# Patient Record
Sex: Male | Born: 1970 | Race: White | Hispanic: No | Marital: Married | Smoking: Current every day smoker
Health system: Southern US, Community
[De-identification: ages and names within clinical notes are randomized; demographics above are authoritative.]

## PROBLEM LIST (undated history)

## (undated) DIAGNOSIS — F419 Anxiety disorder, unspecified: Secondary | ICD-10-CM

## (undated) DIAGNOSIS — E119 Type 2 diabetes mellitus without complications: Secondary | ICD-10-CM

## (undated) DIAGNOSIS — F17200 Nicotine dependence, unspecified, uncomplicated: Secondary | ICD-10-CM

## (undated) DIAGNOSIS — F1010 Alcohol abuse, uncomplicated: Secondary | ICD-10-CM

## (undated) DIAGNOSIS — J45909 Unspecified asthma, uncomplicated: Secondary | ICD-10-CM

---

## 2007-09-12 ENCOUNTER — Emergency Department (HOSPITAL_BASED_OUTPATIENT_CLINIC_OR_DEPARTMENT_OTHER): Admission: EM | Admit: 2007-09-12 | Discharge: 2007-09-12 | Payer: Self-pay | Admitting: Emergency Medicine

## 2007-09-14 ENCOUNTER — Emergency Department (HOSPITAL_BASED_OUTPATIENT_CLINIC_OR_DEPARTMENT_OTHER): Admission: EM | Admit: 2007-09-14 | Discharge: 2007-09-14 | Payer: Self-pay | Admitting: Emergency Medicine

## 2009-11-03 ENCOUNTER — Ambulatory Visit (HOSPITAL_COMMUNITY)
Admission: RE | Admit: 2009-11-03 | Discharge: 2009-11-03 | Payer: Self-pay | Source: Home / Self Care | Admitting: Family Medicine

## 2010-12-20 ENCOUNTER — Ambulatory Visit (HOSPITAL_COMMUNITY)
Admission: RE | Admit: 2010-12-20 | Discharge: 2010-12-20 | Disposition: A | Discharge status: Home / Self Care | Payer: PRIVATE HEALTH INSURANCE | Source: Ambulatory Visit | Attending: Family Medicine | Admitting: Family Medicine

## 2010-12-20 ENCOUNTER — Other Ambulatory Visit (HOSPITAL_COMMUNITY): Payer: Self-pay | Admitting: Family Medicine

## 2010-12-20 DIAGNOSIS — J069 Acute upper respiratory infection, unspecified: Secondary | ICD-10-CM

## 2010-12-20 DIAGNOSIS — R059 Cough, unspecified: Secondary | ICD-10-CM | POA: Insufficient documentation

## 2010-12-20 DIAGNOSIS — R05 Cough: Secondary | ICD-10-CM | POA: Insufficient documentation

## 2012-01-08 ENCOUNTER — Other Ambulatory Visit (HOSPITAL_COMMUNITY): Payer: Self-pay | Admitting: Family Medicine

## 2012-01-08 ENCOUNTER — Ambulatory Visit (HOSPITAL_COMMUNITY)
Admission: RE | Admit: 2012-01-08 | Discharge: 2012-01-08 | Disposition: A | Discharge status: Home / Self Care | Payer: PRIVATE HEALTH INSURANCE | Source: Ambulatory Visit | Attending: Family Medicine | Admitting: Family Medicine

## 2012-01-08 DIAGNOSIS — J4489 Other specified chronic obstructive pulmonary disease: Secondary | ICD-10-CM | POA: Insufficient documentation

## 2012-01-08 DIAGNOSIS — F17200 Nicotine dependence, unspecified, uncomplicated: Secondary | ICD-10-CM | POA: Insufficient documentation

## 2012-01-08 DIAGNOSIS — R062 Wheezing: Secondary | ICD-10-CM

## 2012-01-08 DIAGNOSIS — J449 Chronic obstructive pulmonary disease, unspecified: Secondary | ICD-10-CM | POA: Insufficient documentation

## 2013-12-28 ENCOUNTER — Ambulatory Visit (HOSPITAL_COMMUNITY)
Admission: RE | Admit: 2013-12-28 | Discharge: 2013-12-28 | Disposition: A | Discharge status: Home / Self Care | Payer: 59 | Source: Ambulatory Visit | Attending: Family Medicine | Admitting: Family Medicine

## 2013-12-28 ENCOUNTER — Other Ambulatory Visit (HOSPITAL_COMMUNITY): Payer: Self-pay | Admitting: Family Medicine

## 2013-12-28 DIAGNOSIS — M25511 Pain in right shoulder: Secondary | ICD-10-CM | POA: Insufficient documentation

## 2013-12-28 DIAGNOSIS — R05 Cough: Secondary | ICD-10-CM

## 2013-12-28 DIAGNOSIS — R059 Cough, unspecified: Secondary | ICD-10-CM

## 2014-07-06 ENCOUNTER — Other Ambulatory Visit: Payer: Self-pay | Admitting: Orthopedic Surgery

## 2014-07-18 ENCOUNTER — Encounter (HOSPITAL_BASED_OUTPATIENT_CLINIC_OR_DEPARTMENT_OTHER): Payer: Self-pay | Admitting: *Deleted

## 2014-07-21 ENCOUNTER — Ambulatory Visit (HOSPITAL_BASED_OUTPATIENT_CLINIC_OR_DEPARTMENT_OTHER): Payer: BLUE CROSS/BLUE SHIELD | Admitting: Anesthesiology

## 2014-07-21 ENCOUNTER — Ambulatory Visit (HOSPITAL_BASED_OUTPATIENT_CLINIC_OR_DEPARTMENT_OTHER)
Admission: RE | Admit: 2014-07-21 | Discharge: 2014-07-21 | Disposition: A | Discharge status: Home / Self Care | Payer: BLUE CROSS/BLUE SHIELD | Source: Ambulatory Visit | Attending: Orthopedic Surgery | Admitting: Orthopedic Surgery

## 2014-07-21 ENCOUNTER — Encounter (HOSPITAL_BASED_OUTPATIENT_CLINIC_OR_DEPARTMENT_OTHER)
Admission: RE | Disposition: A | Discharge status: Home / Self Care | Payer: Self-pay | Source: Ambulatory Visit | Attending: Orthopedic Surgery

## 2014-07-21 ENCOUNTER — Encounter (HOSPITAL_BASED_OUTPATIENT_CLINIC_OR_DEPARTMENT_OTHER): Payer: Self-pay | Admitting: Anesthesiology

## 2014-07-21 DIAGNOSIS — E119 Type 2 diabetes mellitus without complications: Secondary | ICD-10-CM | POA: Insufficient documentation

## 2014-07-21 DIAGNOSIS — F17210 Nicotine dependence, cigarettes, uncomplicated: Secondary | ICD-10-CM | POA: Diagnosis not present

## 2014-07-21 DIAGNOSIS — F1010 Alcohol abuse, uncomplicated: Secondary | ICD-10-CM | POA: Diagnosis not present

## 2014-07-21 DIAGNOSIS — F419 Anxiety disorder, unspecified: Secondary | ICD-10-CM | POA: Insufficient documentation

## 2014-07-21 DIAGNOSIS — M1990 Unspecified osteoarthritis, unspecified site: Secondary | ICD-10-CM | POA: Insufficient documentation

## 2014-07-21 DIAGNOSIS — J45909 Unspecified asthma, uncomplicated: Secondary | ICD-10-CM | POA: Diagnosis not present

## 2014-07-21 DIAGNOSIS — G5602 Carpal tunnel syndrome, left upper limb: Secondary | ICD-10-CM | POA: Insufficient documentation

## 2014-07-21 HISTORY — PX: CARPAL TUNNEL RELEASE: SHX101

## 2014-07-21 HISTORY — DX: Type 2 diabetes mellitus without complications: E11.9

## 2014-07-21 HISTORY — DX: Nicotine dependence, unspecified, uncomplicated: F17.200

## 2014-07-21 HISTORY — DX: Unspecified asthma, uncomplicated: J45.909

## 2014-07-21 HISTORY — DX: Alcohol abuse, uncomplicated: F10.10

## 2014-07-21 HISTORY — DX: Anxiety disorder, unspecified: F41.9

## 2014-07-21 LAB — POCT HEMOGLOBIN-HEMACUE: HEMOGLOBIN: 16.8 g/dL (ref 13.0–17.0)

## 2014-07-21 SURGERY — CARPAL TUNNEL RELEASE
Anesthesia: Monitor Anesthesia Care | Site: Wrist | Laterality: Left

## 2014-07-21 MED ORDER — BUPIVACAINE HCL (PF) 0.25 % IJ SOLN
INTRAMUSCULAR | Status: DC | PRN
  Administered 2014-07-21: 7 mL

## 2014-07-21 MED ORDER — LIDOCAINE HCL (CARDIAC) 20 MG/ML IV SOLN
INTRAVENOUS | Status: DC | PRN
  Administered 2014-07-21: 10 mg via INTRAVENOUS

## 2014-07-21 MED ORDER — MIDAZOLAM HCL 2 MG/2ML IJ SOLN
INTRAMUSCULAR | Status: AC
  Filled 2014-07-21: qty 2

## 2014-07-21 MED ORDER — MIDAZOLAM HCL 5 MG/5ML IJ SOLN
INTRAMUSCULAR | Status: DC | PRN
  Administered 2014-07-21: 2 mg via INTRAVENOUS

## 2014-07-21 MED ORDER — FENTANYL CITRATE (PF) 100 MCG/2ML IJ SOLN
INTRAMUSCULAR | Status: AC
  Filled 2014-07-21: qty 6

## 2014-07-21 MED ORDER — MIDAZOLAM HCL 2 MG/2ML IJ SOLN: 1.0000 mg | INTRAMUSCULAR | Status: DC | PRN

## 2014-07-21 MED ORDER — CEFAZOLIN SODIUM-DEXTROSE 2-3 GM-% IV SOLR
2.0000 g | INTRAVENOUS | Status: AC
Start: 2014-07-21 — End: 2014-07-21
  Administered 2014-07-21: 2 g via INTRAVENOUS

## 2014-07-21 MED ORDER — LIDOCAINE HCL (PF) 0.5 % IJ SOLN
INTRAMUSCULAR | Status: DC | PRN
  Administered 2014-07-21: 30 mL via INTRAVENOUS

## 2014-07-21 MED ORDER — ONDANSETRON HCL 4 MG/2ML IJ SOLN
INTRAMUSCULAR | Status: DC | PRN
  Administered 2014-07-21: 4 mg via INTRAVENOUS

## 2014-07-21 MED ORDER — LACTATED RINGERS IV SOLN
INTRAVENOUS | Status: DC
  Administered 2014-07-21: 09:00:00 via INTRAVENOUS

## 2014-07-21 MED ORDER — OXYCODONE HCL 5 MG PO TABS: 5.0000 mg | ORAL_TABLET | Freq: Once | ORAL | Status: DC | PRN

## 2014-07-21 MED ORDER — CHLORHEXIDINE GLUCONATE 4 % EX LIQD: 60.0000 mL | Freq: Once | CUTANEOUS | Status: DC

## 2014-07-21 MED ORDER — CEFAZOLIN SODIUM-DEXTROSE 2-3 GM-% IV SOLR: 2.0000 g | INTRAVENOUS | Status: DC

## 2014-07-21 MED ORDER — FENTANYL CITRATE (PF) 100 MCG/2ML IJ SOLN: 50.0000 ug | INTRAMUSCULAR | Status: DC | PRN

## 2014-07-21 MED ORDER — FENTANYL CITRATE (PF) 100 MCG/2ML IJ SOLN: 25.0000 ug | INTRAMUSCULAR | Status: DC | PRN

## 2014-07-21 MED ORDER — PROPOFOL INFUSION 10 MG/ML OPTIME
INTRAVENOUS | Status: DC | PRN
  Administered 2014-07-21: 75 ug/kg/min via INTRAVENOUS

## 2014-07-21 MED ORDER — CEFAZOLIN SODIUM-DEXTROSE 2-3 GM-% IV SOLR
INTRAVENOUS | Status: AC
  Filled 2014-07-21: qty 50

## 2014-07-21 MED ORDER — GLYCOPYRROLATE 0.2 MG/ML IJ SOLN: 0.2000 mg | Freq: Once | INTRAMUSCULAR | Status: DC | PRN

## 2014-07-21 MED ORDER — OXYCODONE HCL 5 MG/5ML PO SOLN: 5.0000 mg | Freq: Once | ORAL | Status: DC | PRN

## 2014-07-21 MED ORDER — ONDANSETRON HCL 4 MG/2ML IJ SOLN: 4.0000 mg | Freq: Four times a day (QID) | INTRAMUSCULAR | Status: DC | PRN

## 2014-07-21 MED ORDER — FENTANYL CITRATE (PF) 100 MCG/2ML IJ SOLN
INTRAMUSCULAR | Status: DC | PRN
  Administered 2014-07-21 (×2): 50 ug via INTRAVENOUS

## 2014-07-21 MED ORDER — SCOPOLAMINE 1 MG/3DAYS TD PT72: 1.0000 | MEDICATED_PATCH | Freq: Once | TRANSDERMAL | Status: DC | PRN

## 2014-07-21 SURGICAL SUPPLY — 35 items
BLADE SURG 15 STRL LF DISP TIS (BLADE) ×1 IMPLANT
BLADE SURG 15 STRL SS (BLADE) ×2
BNDG COHESIVE 3X5 TAN STRL LF (GAUZE/BANDAGES/DRESSINGS) ×3 IMPLANT
BNDG ESMARK 4X9 LF (GAUZE/BANDAGES/DRESSINGS) IMPLANT
BNDG GAUZE ELAST 4 BULKY (GAUZE/BANDAGES/DRESSINGS) ×3 IMPLANT
CHLORAPREP W/TINT 26ML (MISCELLANEOUS) ×3 IMPLANT
CORDS BIPOLAR (ELECTRODE) ×3 IMPLANT
COVER BACK TABLE 60X90IN (DRAPES) ×3 IMPLANT
COVER MAYO STAND STRL (DRAPES) ×3 IMPLANT
CUFF TOURNIQUET SINGLE 18IN (TOURNIQUET CUFF) ×3 IMPLANT
DRAPE EXTREMITY T 121X128X90 (DRAPE) ×3 IMPLANT
DRAPE SURG 17X23 STRL (DRAPES) ×3 IMPLANT
DRSG PAD ABDOMINAL 8X10 ST (GAUZE/BANDAGES/DRESSINGS) ×3 IMPLANT
GAUZE SPONGE 4X4 12PLY STRL (GAUZE/BANDAGES/DRESSINGS) ×3 IMPLANT
GAUZE XEROFORM 1X8 LF (GAUZE/BANDAGES/DRESSINGS) ×3 IMPLANT
GLOVE BIO SURGEON STRL SZ 6.5 (GLOVE) ×2 IMPLANT
GLOVE BIO SURGEONS STRL SZ 6.5 (GLOVE) ×1
GLOVE BIOGEL PI IND STRL 7.0 (GLOVE) ×2 IMPLANT
GLOVE BIOGEL PI IND STRL 8.5 (GLOVE) ×1 IMPLANT
GLOVE BIOGEL PI INDICATOR 7.0 (GLOVE) ×4
GLOVE BIOGEL PI INDICATOR 8.5 (GLOVE) ×2
GLOVE SURG ORTHO 8.0 STRL STRW (GLOVE) ×3 IMPLANT
GOWN STRL REUS W/ TWL LRG LVL3 (GOWN DISPOSABLE) ×1 IMPLANT
GOWN STRL REUS W/TWL LRG LVL3 (GOWN DISPOSABLE) ×2
GOWN STRL REUS W/TWL XL LVL3 (GOWN DISPOSABLE) ×3 IMPLANT
NEEDLE PRECISIONGLIDE 27X1.5 (NEEDLE) ×3 IMPLANT
NS IRRIG 1000ML POUR BTL (IV SOLUTION) ×3 IMPLANT
PACK BASIN DAY SURGERY FS (CUSTOM PROCEDURE TRAY) ×3 IMPLANT
STOCKINETTE 4X48 STRL (DRAPES) ×3 IMPLANT
SUT ETHILON 4 0 PS 2 18 (SUTURE) ×3 IMPLANT
SUT VICRYL 4-0 PS2 18IN ABS (SUTURE) IMPLANT
SYR BULB 3OZ (MISCELLANEOUS) ×3 IMPLANT
SYR CONTROL 10ML LL (SYRINGE) ×3 IMPLANT
TOWEL OR 17X24 6PK STRL BLUE (TOWEL DISPOSABLE) ×3 IMPLANT
UNDERPAD 30X30 (UNDERPADS AND DIAPERS) ×3 IMPLANT

## 2014-07-21 NOTE — Anesthesia Preprocedure Evaluation (Signed)
Anesthesia Evaluation  Patient identified by MRN, date of birth, ID band Patient awake    Reviewed: Allergy & Precautions, NPO status , Patient's Chart, lab work & pertinent test results  Airway Mallampati: II   Neck ROM: full    Dental   Pulmonary asthma , Current Smoker,  breath sounds clear to auscultation        Cardiovascular negative cardio ROS  Rhythm:regular Rate:Normal     Neuro/Psych Anxiety    GI/Hepatic (+)     substance abuse  alcohol use,   Endo/Other  diabetes, Type 2  Renal/GU      Musculoskeletal   Abdominal   Peds  Hematology   Anesthesia Other Findings   Reproductive/Obstetrics                             Anesthesia Physical Anesthesia Plan  ASA: II  Anesthesia Plan: MAC and Bier Block   Post-op Pain Management:    Induction: Intravenous  Airway Management Planned: Simple Face Mask  Additional Equipment:   Intra-op Plan:   Post-operative Plan:   Informed Consent: I have reviewed the patients History and Physical, chart, labs and discussed the procedure including the risks, benefits and alternatives for the proposed anesthesia with the patient or authorized representative who has indicated his/her understanding and acceptance.     Plan Discussed with: CRNA, Anesthesiologist and Surgeon  Anesthesia Plan Comments:         Anesthesia Quick Evaluation

## 2014-07-21 NOTE — Brief Op Note (Signed)
07/21/2014  9:53 AM  PATIENT:  Susa Loffleravid J Thai  44 y.o. male  PRE-OPERATIVE DIAGNOSIS:  LEFT CARPAL TUNNEL SYNDROME  POST-OPERATIVE DIAGNOSIS:  left carpal tunnel syndrome  PROCEDURE:  Procedure(s): LEFT CARPAL TUNNEL RELEASE (Left)  SURGEON:  Surgeon(s) and Role:    * Cindee SaltGary Sarafina Puthoff, MD - Primary  PHYSICIAN ASSISTANT:   ASSISTANTS: none   ANESTHESIA:   local and regional  EBL:  Total I/O In: 800 [I.V.:800] Out: -   BLOOD ADMINISTERED:none  DRAINS: none   LOCAL MEDICATIONS USED:  BUPIVICAINE   SPECIMEN:  No Specimen  DISPOSITION OF SPECIMEN:  N/A  COUNTS:  YES  TOURNIQUET:   Total Tourniquet Time Documented: Forearm (Left) - 17 minutes Total: Forearm (Left) - 17 minutes   DICTATION: .Other Dictation: Dictation Number [id number]  PLAN OF CARE: Discharge to home after PACU  PATIENT DISPOSITION:  PACU - hemodynamically stable.

## 2014-07-21 NOTE — Discharge Instructions (Addendum)

## 2014-07-21 NOTE — H&P (Signed)
  Mitchell Phillips is 44 years old complaining of numbness and tingling both hands, left greater than right. This has been going on and says it is not getting any better. He was last seen in 2014 in January. He has no new history of injury. He is awakened occasionally at night. He has seen a neurologist. He complains of intermittent, severe throbbing type pain with a feeling numbness. States it is getting worse. He has had his nerve conductions done revealing carpal tunnel syndrome bilaterally with motor delay of 5.3/left and 5.4/right;  sensory delay 3.6/left and 4.1/right;  amplitude diminution to 15/left and 12/right  PAST MEDICAL HISTORY:  He has no known drug allergies. He takes no medicines other than Turmeric. He has had no surgery. He does have a history of diabetes.  FAMILY MEDICAL HISTORY: Positive for diabetes, high BP and arthritis.   SOCIAL HISTORY:  He smokes 1 PPD advised to quit and the reasons behind this. He drinks socially. 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  REVIEW OF SYSTEMS: Positive for shortness of breath otherwise negative 14 points Mitchell LofflerDavid J Phillips is an 44 y.o. male.   Chief Complaint: CTS left HPI: see above  Past Medical History  Diagnosis Date  . Asthma   . Diabetes mellitus without complication     diet controlled  . Anxiety   . Heavy smoker   . ETOH abuse     History reviewed. No pertinent past surgical history.  History reviewed. No pertinent family history. Social History:  reports that he has been smoking.  He does not have any smokeless tobacco history on file. He reports that he drinks alcohol. His drug history is not on file.  Allergies: No Known Allergies  No prescriptions prior to admission    No results found for this or any previous visit (from the past 48 hour(s)).  No results found.   Pertinent items are noted in HPI.  Height 6' (1.829 m), weight 72.576 kg (160 lb).  General appearance: alert, cooperative and appears stated  age Head: Normocephalic, without obvious abnormality Neck: no JVD Resp: clear to auscultation bilaterally Cardio: regular rate and rhythm, S1, S2 normal, no murmur, click, rub or gallop GI: soft, non-tender; bowel sounds normal; no masses,  no organomegaly Extremities: numbness left hand Pulses: 2+ and symmetric Skin: Skin color, texture, turgor normal. No rashes or lesions Neurologic: Grossly normal Incision/Wound: na  Assessment/Plan DIAGNOSIS: Left carpal tunnel syndrome.  PLAN: He is desirous of proceeding to have this surgically released in that these are worsening from his study in 2014.  We have discussed possibility of injection.  He has declined and he would like to have the left side operated on.   The pre, peri and postoperative course were discussed along with the risks and complications.  The patient is aware there is no guarantee with the surgery, possibility of infection, recurrence, injury to arteries, nerves, tendons, incomplete relief of symptoms and dystrophy.  He is scheduled for left carpal tunnel release as an outpatient under regional anesthesia.  Devron Cohick R 07/21/2014, 7:45 AM

## 2014-07-21 NOTE — Anesthesia Postprocedure Evaluation (Signed)
Anesthesia Post Note  Patient: Mitchell LofflerDavid J Phillips  Procedure(s) Performed: Procedure(s) (LRB): LEFT CARPAL TUNNEL RELEASE (Left)  Anesthesia type: MAC  Patient location: PACU  Post pain: Pain level controlled and Adequate analgesia  Post assessment: Post-op Vital signs reviewed, Patient's Cardiovascular Status Stable and Respiratory Function Stable  Last Vitals:  Filed Vitals:   07/21/14 1042  BP: 125/78  Pulse: 56  Temp: 36.6 C  Resp: 20    Post vital signs: Reviewed and stable  Level of consciousness: awake, alert  and oriented  Complications: No apparent anesthesia complications

## 2014-07-21 NOTE — Op Note (Signed)
Dictation Number [id number]

## 2014-07-21 NOTE — Transfer of Care (Signed)
Immediate Anesthesia Transfer of Care Note  Patient: Mitchell LofflerDavid J Scheidegger  Procedure(s) Performed: Procedure(s): LEFT CARPAL TUNNEL RELEASE (Left)  Patient Location: PACU  Anesthesia Type:Bier block  Level of Consciousness: awake and patient cooperative  Airway & Oxygen Therapy: Patient Spontanous Breathing and Patient connected to face mask oxygen  Post-op Assessment: Report given to RN and Post -op Vital signs reviewed and stable  Post vital signs: Reviewed and stable  Last Vitals:  Filed Vitals:   07/21/14 0840  BP: 119/82  Pulse: 66  Temp: 36.6 C  Resp: 18    Complications: No apparent anesthesia complications

## 2014-07-22 ENCOUNTER — Encounter (HOSPITAL_BASED_OUTPATIENT_CLINIC_OR_DEPARTMENT_OTHER): Payer: Self-pay | Admitting: Orthopedic Surgery

## 2014-07-22 NOTE — Op Note (Signed)
NAMScarlette Phillips:  Efferson, Burnell              ACCOUNT NO.:  [id number]  MEDICAL RECORD NO.:  [id number]  LOCATION:                               FACILITY:  [facility]  PHYSICIAN:  Cindee SaltGary Salvator Seppala, M.D.       DATE OF BIRTH:  01/05/1948  DATE OF PROCEDURE:  07/21/2014 DATE OF DISCHARGE:  07/21/2014                              OPERATIVE REPORT   PREOPERATIVE DIAGNOSIS:  Carpal tunnel syndrome, left hand.  POSTOPERATIVE DIAGNOSIS:  Carpal tunnel syndrome, left hand.  OPERATION:  Decompression of left median nerve.  SURGEON:  Cindee SaltGary Luretta Everly, M.D.  ANESTHESIA:  Forearm-based IV regional with local infiltration.  ANESTHESIOLOGIST:  Achille RichAdam Hodierne, M.D.  HISTORY:  The patient is a 44 year old male, with a history of carpal tunnel syndrome and nerve conduction is positive bilaterally.  He has elected to undergo surgical decompression.  Pre, peri, and postoperative course are discussed along with the risks and complications.  He is aware that there is no guarantee with the surgery, possibility of infection; recurrence of injury to arteries, nerves, tendons; incomplete relief of symptoms, dystrophy.  In preoperative area, the patient is seen, the extremity marked by both the patient and surgeon.  Antibiotic given.  PROCEDURE IN DETAIL:  The patient was brought to the operating room, where forearm-based IV regional anesthetic was carried out without difficulty.  He was prepped using ChloraPrep, supine position with the left arm free.  A 3-minute dry time was allowed.  Time-out taken, confirming the patient and procedure.  A longitudinal incision was made in the left palm, carried down through the subcutaneous tissue. Bleeders were electrocauterized with bipolar.  He had some feeling.  A local infiltration with 0.25% bupivacaine without epinephrine was given. The dissection was then continued down to the palmar fascia, which was split.  A superficial palmar arch identified.  The flexor tendon of the ring and  little finger identified to the ulnar side of the median nerve. The carpal retinaculum was incised with sharp dissection after placement of retractors to protect to protect both median and ulnar nerve.  A right angle and Sewall retractor were placed between skin and forearm fascia proximally, the nerve was bluntly dissected from the retinaculum and subcutaneous tissue bluntly dissected.  The distal fascia was then released for approximately 2 cm to 3 cm proximal to the wrist crease under direct vision.  The canal was explored.  Area of compression to the nerve was apparent.  Motor branch entered into muscle distally.  The wound was copiously irrigated with saline and skin closed with interrupted 4-0 nylon sutures.  A further infiltration with 0.25% bupivacaine was given approximately 8 mL was used.  Sterile compressive dressing with the fingers free was applied.  On deflation of the tourniquet, all fingers immediately pinked.  He was taken to the recovery room for observation in satisfactory condition.  He will be discharged home to [facility] on oxycodone.          ______________________________ Cindee SaltGary Corydon Schweiss, M.D.     GK/MEDQ  D:  07/21/2014  T:  07/21/2014  Job:  [id number]

## 2014-07-22 NOTE — Addendum Note (Signed)
Addendum  created 07/22/14 [id number] by Lance CoonWesley Leiyah Maultsby, CRNA   Modules edited: Charges VN

## 2014-08-25 ENCOUNTER — Other Ambulatory Visit: Payer: Self-pay | Admitting: Orthopedic Surgery

## 2014-08-29 ENCOUNTER — Encounter (HOSPITAL_BASED_OUTPATIENT_CLINIC_OR_DEPARTMENT_OTHER): Payer: Self-pay | Admitting: *Deleted

## 2014-09-01 ENCOUNTER — Ambulatory Visit (HOSPITAL_BASED_OUTPATIENT_CLINIC_OR_DEPARTMENT_OTHER): Payer: BLUE CROSS/BLUE SHIELD | Admitting: Anesthesiology

## 2014-09-01 ENCOUNTER — Encounter (HOSPITAL_BASED_OUTPATIENT_CLINIC_OR_DEPARTMENT_OTHER): Payer: Self-pay | Admitting: *Deleted

## 2014-09-01 ENCOUNTER — Encounter (HOSPITAL_BASED_OUTPATIENT_CLINIC_OR_DEPARTMENT_OTHER)
Admission: RE | Disposition: A | Discharge status: Home / Self Care | Payer: Self-pay | Source: Ambulatory Visit | Attending: Orthopedic Surgery

## 2014-09-01 ENCOUNTER — Ambulatory Visit (HOSPITAL_BASED_OUTPATIENT_CLINIC_OR_DEPARTMENT_OTHER)
Admission: RE | Admit: 2014-09-01 | Discharge: 2014-09-01 | Disposition: A | Discharge status: Home / Self Care | Payer: BLUE CROSS/BLUE SHIELD | Source: Ambulatory Visit | Attending: Orthopedic Surgery | Admitting: Orthopedic Surgery

## 2014-09-01 DIAGNOSIS — F419 Anxiety disorder, unspecified: Secondary | ICD-10-CM | POA: Diagnosis not present

## 2014-09-01 DIAGNOSIS — G5601 Carpal tunnel syndrome, right upper limb: Secondary | ICD-10-CM | POA: Insufficient documentation

## 2014-09-01 DIAGNOSIS — F17210 Nicotine dependence, cigarettes, uncomplicated: Secondary | ICD-10-CM | POA: Insufficient documentation

## 2014-09-01 DIAGNOSIS — E119 Type 2 diabetes mellitus without complications: Secondary | ICD-10-CM | POA: Diagnosis not present

## 2014-09-01 DIAGNOSIS — J45909 Unspecified asthma, uncomplicated: Secondary | ICD-10-CM | POA: Diagnosis not present

## 2014-09-01 HISTORY — PX: CARPAL TUNNEL RELEASE: SHX101

## 2014-09-01 SURGERY — CARPAL TUNNEL RELEASE
Anesthesia: Regional | Site: Wrist | Laterality: Right

## 2014-09-01 MED ORDER — BUPIVACAINE HCL (PF) 0.25 % IJ SOLN
INTRAMUSCULAR | Status: DC | PRN
Start: 2014-09-01 — End: 2014-09-01
  Administered 2014-09-01: 8 mL

## 2014-09-01 MED ORDER — ONDANSETRON HCL 4 MG/2ML IJ SOLN: 4.0000 mg | Freq: Once | INTRAMUSCULAR | Status: DC | PRN

## 2014-09-01 MED ORDER — GLYCOPYRROLATE 0.2 MG/ML IJ SOLN: 0.2000 mg | Freq: Once | INTRAMUSCULAR | Status: DC | PRN

## 2014-09-01 MED ORDER — FENTANYL CITRATE (PF) 100 MCG/2ML IJ SOLN
INTRAMUSCULAR | Status: DC | PRN
  Administered 2014-09-01 (×2): 50 ug via INTRAVENOUS

## 2014-09-01 MED ORDER — CODEINE SULFATE 15 MG PO TABS: 15.0000 mg | ORAL_TABLET | Freq: Four times a day (QID) | ORAL | Status: AC | PRN

## 2014-09-01 MED ORDER — CHLORHEXIDINE GLUCONATE 4 % EX LIQD: 60.0000 mL | Freq: Once | CUTANEOUS | Status: DC

## 2014-09-01 MED ORDER — MEPERIDINE HCL 25 MG/ML IJ SOLN: 6.2500 mg | INTRAMUSCULAR | Status: DC | PRN

## 2014-09-01 MED ORDER — CEFAZOLIN SODIUM-DEXTROSE 2-3 GM-% IV SOLR
2.0000 g | INTRAVENOUS | Status: AC
  Administered 2014-09-01: 2 g via INTRAVENOUS

## 2014-09-01 MED ORDER — CEFAZOLIN SODIUM-DEXTROSE 2-3 GM-% IV SOLR
INTRAVENOUS | Status: AC
  Filled 2014-09-01: qty 50

## 2014-09-01 MED ORDER — PROPOFOL INFUSION 10 MG/ML OPTIME
INTRAVENOUS | Status: DC | PRN
  Administered 2014-09-01: 100 ug/kg/min via INTRAVENOUS

## 2014-09-01 MED ORDER — CEFAZOLIN SODIUM-DEXTROSE 2-3 GM-% IV SOLR: 2.0000 g | INTRAVENOUS | Status: DC

## 2014-09-01 MED ORDER — BUPIVACAINE HCL (PF) 0.25 % IJ SOLN
INTRAMUSCULAR | Status: AC
  Filled 2014-09-01: qty 30

## 2014-09-01 MED ORDER — SCOPOLAMINE 1 MG/3DAYS TD PT72: 1.0000 | MEDICATED_PATCH | Freq: Once | TRANSDERMAL | Status: DC | PRN

## 2014-09-01 MED ORDER — LACTATED RINGERS IV SOLN
INTRAVENOUS | Status: DC
  Administered 2014-09-01 (×2): via INTRAVENOUS

## 2014-09-01 MED ORDER — MIDAZOLAM HCL 2 MG/2ML IJ SOLN
INTRAMUSCULAR | Status: AC
Start: 2014-09-01 — End: 2014-09-01
  Filled 2014-09-01: qty 2

## 2014-09-01 MED ORDER — HYDROMORPHONE HCL 1 MG/ML IJ SOLN: 0.2500 mg | INTRAMUSCULAR | Status: DC | PRN

## 2014-09-01 MED ORDER — MIDAZOLAM HCL 5 MG/5ML IJ SOLN
INTRAMUSCULAR | Status: DC | PRN
  Administered 2014-09-01 (×2): 1 mg via INTRAVENOUS

## 2014-09-01 MED ORDER — FENTANYL CITRATE (PF) 100 MCG/2ML IJ SOLN
INTRAMUSCULAR | Status: AC
  Filled 2014-09-01: qty 4

## 2014-09-01 MED ORDER — PROPOFOL 500 MG/50ML IV EMUL
INTRAVENOUS | Status: AC
  Filled 2014-09-01: qty 50

## 2014-09-01 SURGICAL SUPPLY — 34 items
BLADE SURG 15 STRL LF DISP TIS (BLADE) ×1 IMPLANT
BLADE SURG 15 STRL SS (BLADE) ×2
BNDG COHESIVE 3X5 TAN STRL LF (GAUZE/BANDAGES/DRESSINGS) ×3 IMPLANT
BNDG ESMARK 4X9 LF (GAUZE/BANDAGES/DRESSINGS) IMPLANT
BNDG GAUZE ELAST 4 BULKY (GAUZE/BANDAGES/DRESSINGS) ×3 IMPLANT
CHLORAPREP W/TINT 26ML (MISCELLANEOUS) ×3 IMPLANT
CORDS BIPOLAR (ELECTRODE) ×3 IMPLANT
COVER BACK TABLE 60X90IN (DRAPES) ×3 IMPLANT
COVER MAYO STAND STRL (DRAPES) ×3 IMPLANT
CUFF TOURNIQUET SINGLE 18IN (TOURNIQUET CUFF) ×3 IMPLANT
DRAPE EXTREMITY T 121X128X90 (DRAPE) ×3 IMPLANT
DRAPE SURG 17X23 STRL (DRAPES) ×3 IMPLANT
DRSG PAD ABDOMINAL 8X10 ST (GAUZE/BANDAGES/DRESSINGS) ×3 IMPLANT
GAUZE SPONGE 4X4 12PLY STRL (GAUZE/BANDAGES/DRESSINGS) ×3 IMPLANT
GAUZE XEROFORM 1X8 LF (GAUZE/BANDAGES/DRESSINGS) ×3 IMPLANT
GLOVE BIOGEL PI IND STRL 7.0 (GLOVE) ×2 IMPLANT
GLOVE BIOGEL PI IND STRL 8.5 (GLOVE) ×1 IMPLANT
GLOVE BIOGEL PI INDICATOR 7.0 (GLOVE) ×4
GLOVE BIOGEL PI INDICATOR 8.5 (GLOVE) ×2
GLOVE ECLIPSE 6.5 STRL STRAW (GLOVE) ×3 IMPLANT
GLOVE SURG ORTHO 8.0 STRL STRW (GLOVE) ×3 IMPLANT
GOWN STRL REUS W/ TWL LRG LVL3 (GOWN DISPOSABLE) ×1 IMPLANT
GOWN STRL REUS W/TWL LRG LVL3 (GOWN DISPOSABLE) ×2
GOWN STRL REUS W/TWL XL LVL3 (GOWN DISPOSABLE) ×3 IMPLANT
NEEDLE PRECISIONGLIDE 27X1.5 (NEEDLE) ×3 IMPLANT
NS IRRIG 1000ML POUR BTL (IV SOLUTION) ×3 IMPLANT
PACK BASIN DAY SURGERY FS (CUSTOM PROCEDURE TRAY) ×3 IMPLANT
STOCKINETTE 4X48 STRL (DRAPES) ×3 IMPLANT
SUT ETHILON 4 0 PS 2 18 (SUTURE) ×3 IMPLANT
SUT VICRYL 4-0 PS2 18IN ABS (SUTURE) IMPLANT
SYR BULB 3OZ (MISCELLANEOUS) ×3 IMPLANT
SYR CONTROL 10ML LL (SYRINGE) ×3 IMPLANT
TOWEL OR 17X24 6PK STRL BLUE (TOWEL DISPOSABLE) ×3 IMPLANT
UNDERPAD 30X30 (UNDERPADS AND DIAPERS) ×3 IMPLANT

## 2014-09-01 NOTE — Anesthesia Preprocedure Evaluation (Signed)
Anesthesia Evaluation  Patient identified by MRN, date of birth, ID band Patient awake    Reviewed: Allergy & Precautions, NPO status , Patient's Chart, lab work & pertinent test results  Airway Mallampati: II  TM Distance: >3 FB Neck ROM: Full    Dental   Pulmonary Current Smoker,    Pulmonary exam normal       Cardiovascular Normal cardiovascular exam    Neuro/Psych Anxiety    GI/Hepatic   Endo/Other  diabetes, Type 2, Oral Hypoglycemic Agents  Renal/GU      Musculoskeletal   Abdominal   Peds  Hematology   Anesthesia Other Findings   Reproductive/Obstetrics                             Anesthesia Physical Anesthesia Plan  ASA: II  Anesthesia Plan: Bier Block   Post-op Pain Management:    Induction: Intravenous  Airway Management Planned: Simple Face Mask  Additional Equipment:   Intra-op Plan:   Post-operative Plan:   Informed Consent: I have reviewed the patients History and Physical, chart, labs and discussed the procedure including the risks, benefits and alternatives for the proposed anesthesia with the patient or authorized representative who has indicated his/her understanding and acceptance.     Plan Discussed with: CRNA and Surgeon  Anesthesia Plan Comments:         Anesthesia Quick Evaluation

## 2014-09-01 NOTE — Anesthesia Postprocedure Evaluation (Signed)
Anesthesia Post Note  Patient: Mitchell Phillips  Procedure(s) Performed: Procedure(s) (LRB): RIGHT CARPAL TUNNEL RELEASE (Right)  Anesthesia type: general  Patient location: PACU  Post pain: Pain level controlled  Post assessment: Patient's Cardiovascular Status Stable  Last Vitals:  Filed Vitals:   09/01/14 0930  BP: 118/83  Pulse: 83  Temp: 36.5 C  Resp: 20    Post vital signs: Reviewed and stable  Level of consciousness: sedated  Complications: No apparent anesthesia complications

## 2014-09-01 NOTE — Op Note (Signed)
Dictation Number [id number]

## 2014-09-01 NOTE — Transfer of Care (Signed)
Immediate Anesthesia Transfer of Care Note  Patient: Mitchell Phillips  Procedure(s) Performed: Procedure(s): RIGHT CARPAL TUNNEL RELEASE (Right)  Patient Location: PACU  Anesthesia Type:Bier block  Level of Consciousness: awake, oriented and patient cooperative  Airway & Oxygen Therapy: Patient Spontanous Breathing and Patient connected to face mask oxygen  Post-op Assessment: Report given to RN and Post -op Vital signs reviewed and stable  Post vital signs: Reviewed and stable  Last Vitals:  Filed Vitals:   09/01/14 0720  BP: 119/80  Pulse: 77  Temp: 36.8 C  Resp: 16    Complications: No apparent anesthesia complications

## 2014-09-01 NOTE — Discharge Instructions (Addendum)

## 2014-09-01 NOTE — H&P (Signed)
  Mitchell Phillips is a 44yo male complaining of numbness and tingling both hands, left greater than right. This has been going on and says it is not getting any better. He was last seen in 2014 in January. He has no new history of injury. He is awakened occasionally at night. He has seen a neurologist. He complains of intermittent, severe throbbing type pain with a feeling numbness. States it is getting worse. He has had a left carpal tunnel release  PAST MEDICAL HISTORY:  He has no known drug allergies. He takes no medicines other than Turmeric. He has had no surgery. He does have a history of diabetes.  FAMILY MEDICAL HISTORY: Positive for diabetes, high BP and arthritis.   SOCIAL HISTORY:  He smokes 1 PPD advised to quit and the reasons behind this. He drinks socially. He is married and a Consulting civil engineer.  REVIEW OF SYSTEMS: Positive for shortness of breath otherwise negative 14 points.  Mitchell Phillips is an 44 y.o. male.   Chief Complaint: CTS right hand HPI: see above  Past Medical History  Diagnosis Date  . Asthma   . Diabetes mellitus without complication     diet controlled  . Anxiety   . Heavy smoker   . ETOH abuse     Past Surgical History  Procedure Laterality Date  . Carpal tunnel release Left 07/21/2014    Procedure: LEFT CARPAL TUNNEL RELEASE;  Surgeon: Cindee Salt, MD;  Location: [facility];  Service: Orthopedics;  Laterality: Left;    History reviewed. No pertinent family history. Social History:  reports that he has been smoking.  He does not have any smokeless tobacco history on file. He reports that he drinks alcohol. His drug history is not on file.  Allergies: No Known Allergies  Medications Prior to Admission  Medication Sig Dispense Refill  . albuterol (PROVENTIL HFA;VENTOLIN HFA) 108 (90 BASE) MCG/ACT inhaler Inhale into the lungs every 6 (six) hours as needed for wheezing or shortness of breath.    . diazepam (VALIUM) 2 MG tablet Take 2  mg by mouth every 6 (six) hours as needed for anxiety.      No results found for this or any previous visit (from the past 48 hour(s)).  No results found.   Pertinent items are noted in HPI.  Blood pressure 119/80, pulse 77, temperature 98.2 F (36.8 C), temperature source Oral, resp. rate 16, height 6' (1.829 m), weight 69.4 kg (153 lb), SpO2 98 %.  General appearance: alert, cooperative and appears stated age Head: Normocephalic, without obvious abnormality Neck: no JVD Resp: clear to auscultation bilaterally Cardio: regular rate and rhythm, S1, S2 normal, no murmur, click, rub or gallop GI: soft, non-tender; bowel sounds normal; no masses,  no organomegaly Extremities: numbness right hand Pulses: 2+ and symmetric Skin: Skin color, texture, turgor normal. No rashes or lesions Neurologic: Grossly normal Incision/Wound: na  Assessment/Plan  DIAGNOSIS: Right carpal tunnel syndrome. He would like to proceed to have the right side released. He is scheduled for right carpal tunnel release as an outpatient under regional anesthesia. Pre, peri and post op care are discussed along with risks and complications. Patient is aware there is no guarantee with surgery, possibility of infection, injury to arteries, nerves, and tendons, incomplete relief and dystrophy.  Leonarda Leis R 09/01/2014, 7:35 AM

## 2014-09-01 NOTE — Brief Op Note (Signed)
09/01/2014  9:00 AM  PATIENT:  Mitchell Phillips  44 y.o. male  PRE-OPERATIVE DIAGNOSIS:  Carpal tunnel syndrome right  G56.01  POST-OPERATIVE DIAGNOSIS:  carpal tunnelsyndrome right  PROCEDURE:  Procedure(s): RIGHT CARPAL TUNNEL RELEASE (Right)  SURGEON:  Surgeon(s) and Role:    * Cindee Salt, MD - Primary  PHYSICIAN ASSISTANT:   ASSISTANTS: none   ANESTHESIA:   local and regional  EBL:  Total I/O In: 1200 [I.V.:1200] Out: -   BLOOD ADMINISTERED:none  DRAINS: none   LOCAL MEDICATIONS USED:  BUPIVICAINE   SPECIMEN:  No Specimen  DISPOSITION OF SPECIMEN:  N/A  COUNTS:  YES  TOURNIQUET:   Total Tourniquet Time Documented: Forearm (Right) - 19 minutes Total: Forearm (Right) - 19 minutes   DICTATION: .Other Dictation: Dictation Number [id number]  PLAN OF CARE: Discharge to home after PACU  PATIENT DISPOSITION:  PACU - hemodynamically stable.

## 2014-09-01 NOTE — Op Note (Signed)
NAMEGARNELL, PHENIX NO.:  [id number]  MEDICAL RECORD NO.:  [id number]  LOCATION:                               FACILITY:  [facility]  PHYSICIAN:  Cindee Salt, M.D.       DATE OF BIRTH:  12-24-1970  DATE OF PROCEDURE:  09/01/2014 DATE OF DISCHARGE:  09/01/2014                              OPERATIVE REPORT   PREOPERATIVE DIAGNOSIS:  Carpal tunnel syndrome, right hand.  POSTOPERATIVE DIAGNOSIS:  Carpal tunnel syndrome, right hand.  OPERATION:  Decompression of right median nerve.  SURGEON:  Cindee Salt, M.D.  ANESTHESIA:  Forearm-based IV regional with local infiltration.  HISTORY:  The patient is a 44 year old male with a history of bilateral carpal tunnel syndrome.  He has undergone release of this left side.  He is admitted now for release of the right.  Pre, peri, and postoperative course have been discussed along with risks and complications.  He is aware that there is no guarantee with the surgery with possibility of infection; recurrence of injury to arteries, nerves, tendons; incomplete relief of symptoms, dystrophy.  In the preoperative area, the patient is seen, the extremity was marked by both the patient and surgeon. Antibiotic given.  PROCEDURE IN DETAIL:  The patient was brought to the operating room, where a forearm-based IV regional anesthetic was carried out without difficulty.  He was prepped using ChloraPrep, supine position with the right arm free.  A 3-minute dry time was allowed.  Time-out taken confirming the patient and procedure.  A longitudinal incision was made in the right palm, carried down through subcutaneous tissue.  Bleeders were electrocauterized with bipolar.  The palmar fascia was split. Superficial palmar arch identified.  Flexor tendon of the ring and little finger identified to the ulnar side of the median nerve.  Carpal retinaculum was incised with sharp dissection.  Right angle and Sewall retractor were placed between skin  and forearm fascia.  The fascia released for approximately 2 cm proximal to the wrist crease under direct vision.  Canal was explored.  Motor branch entered in the muscle. Very compression to the nerve was apparent.  No further lesions were identified.  The wound was irrigated, and skin closed with 4-0 nylon sutures.  Local infiltration with 0.25% bupivacaine without epinephrine was given, approximately 7 mL was used.  Sterile compressive dressing with the fingers free was applied.  On deflation of the tourniquet, all fingers immediately pinked.  He was taken to the recovery room for observation in satisfactory condition.  He will be discharged home to return to the [facility] in 1 week.          ______________________________ Cindee Salt, M.D.     GK/MEDQ  D:  09/01/2014  T:  09/01/2014  Job:  [id number]

## 2014-09-02 ENCOUNTER — Encounter (HOSPITAL_BASED_OUTPATIENT_CLINIC_OR_DEPARTMENT_OTHER): Payer: Self-pay | Admitting: Orthopedic Surgery

## 2016-03-06 IMAGING — CR DG CHEST 2V
3 series · 3 of 3 positions shown · non-contrast
Comparison: 01/08/2012.

CLINICAL DATA: Pain right sternoclavicular joint. No known injury.
Initial evaluation .

EXAM:
CHEST  2 VIEW

[view not recorded (1 of 3)]
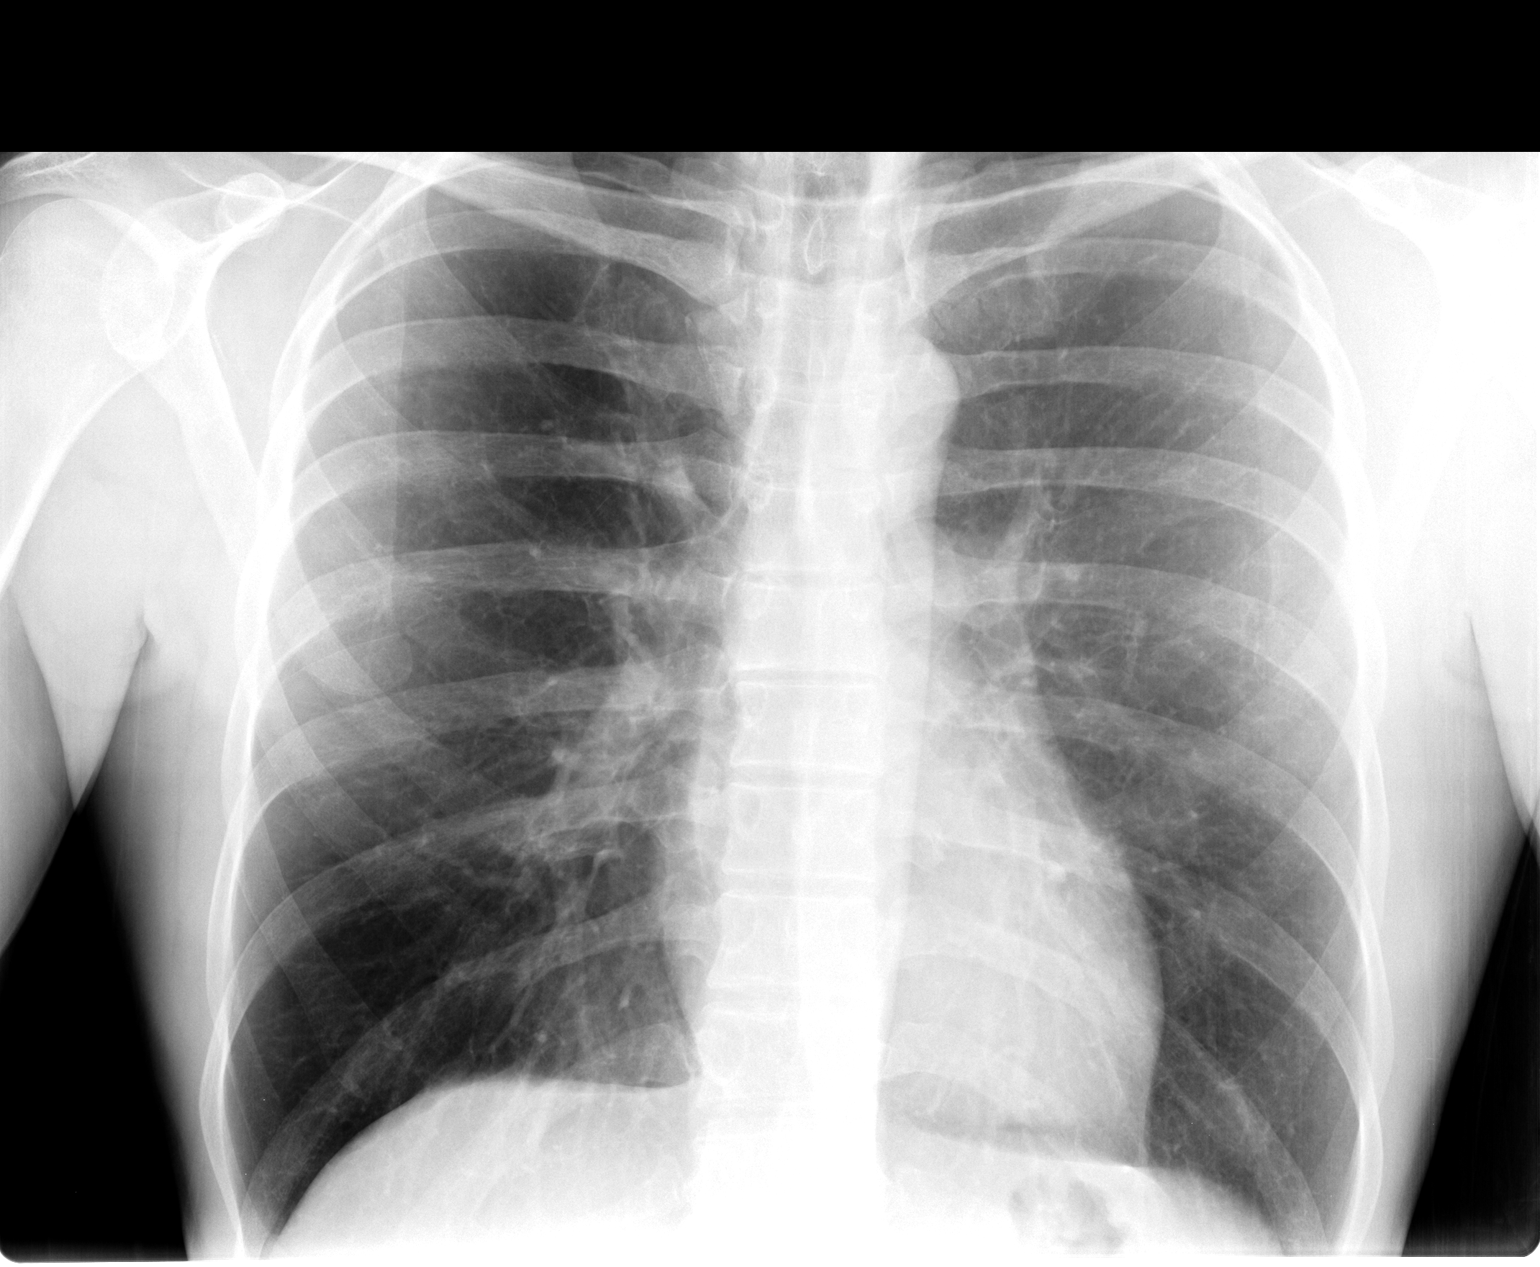

[view not recorded (2 of 3)]
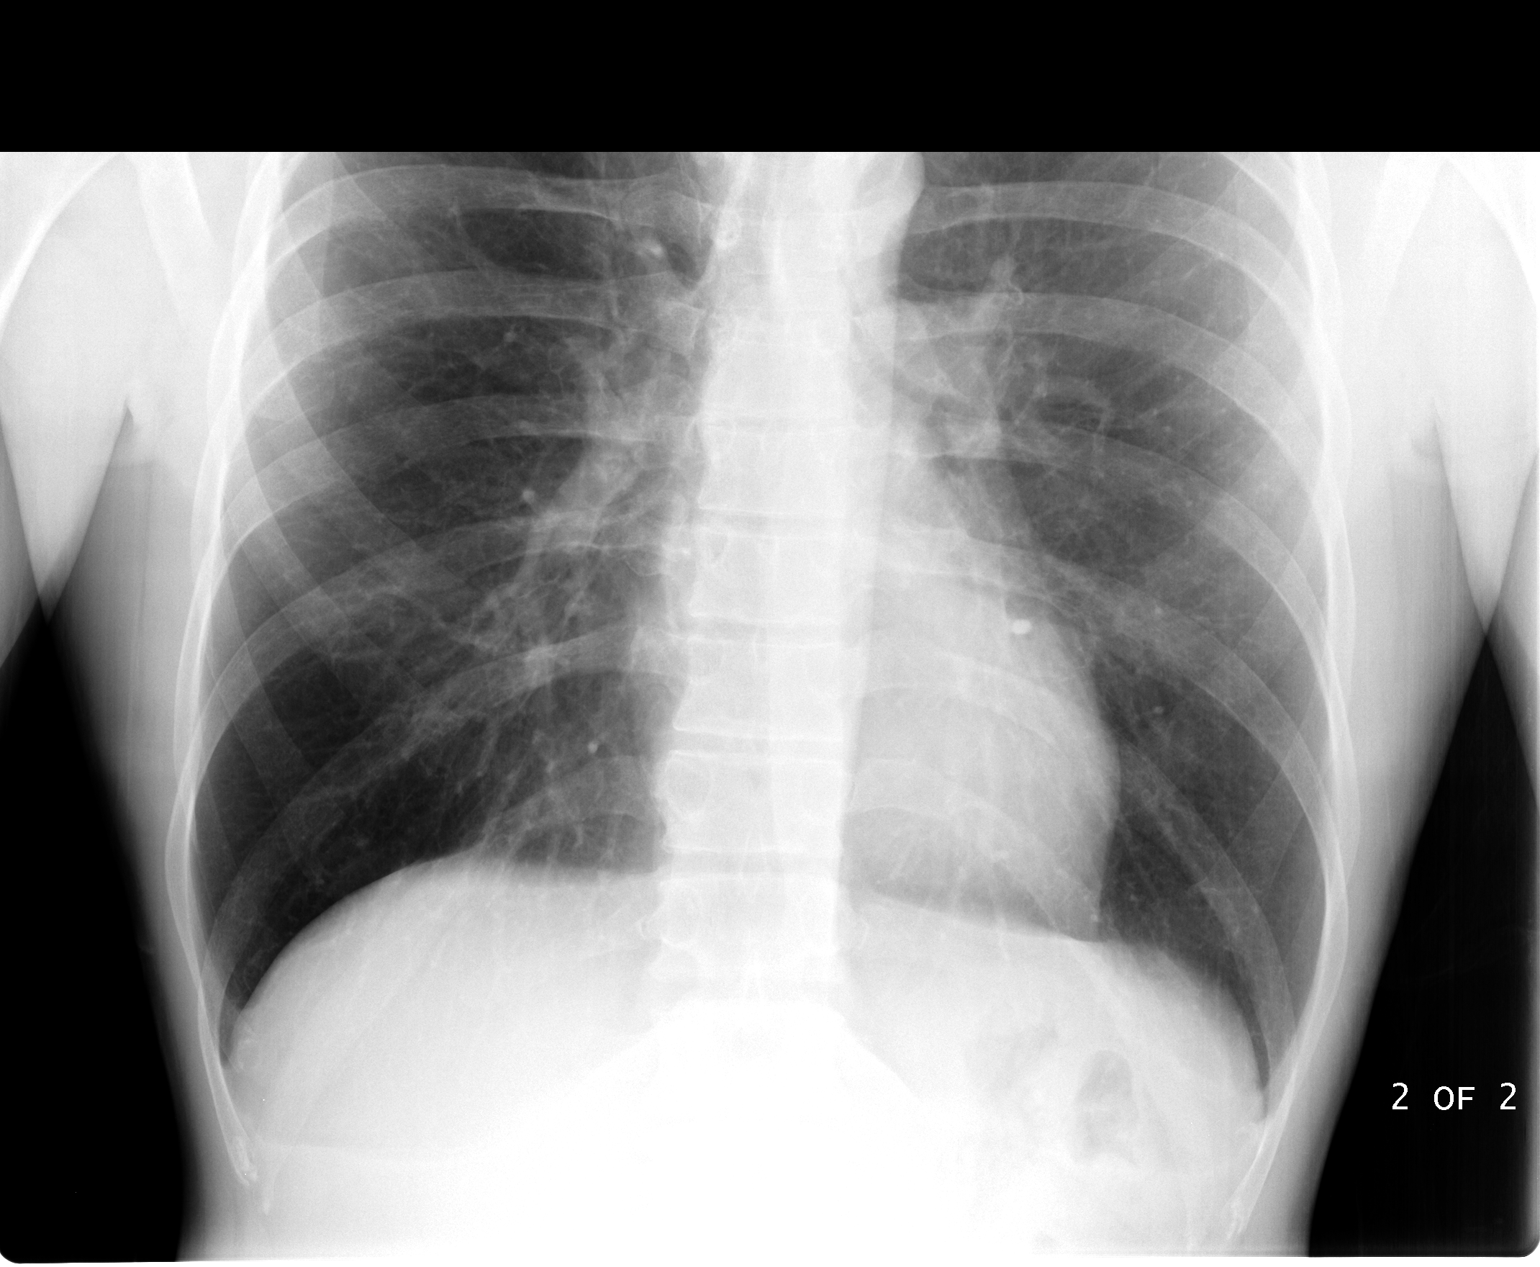

[view not recorded (3 of 3)]
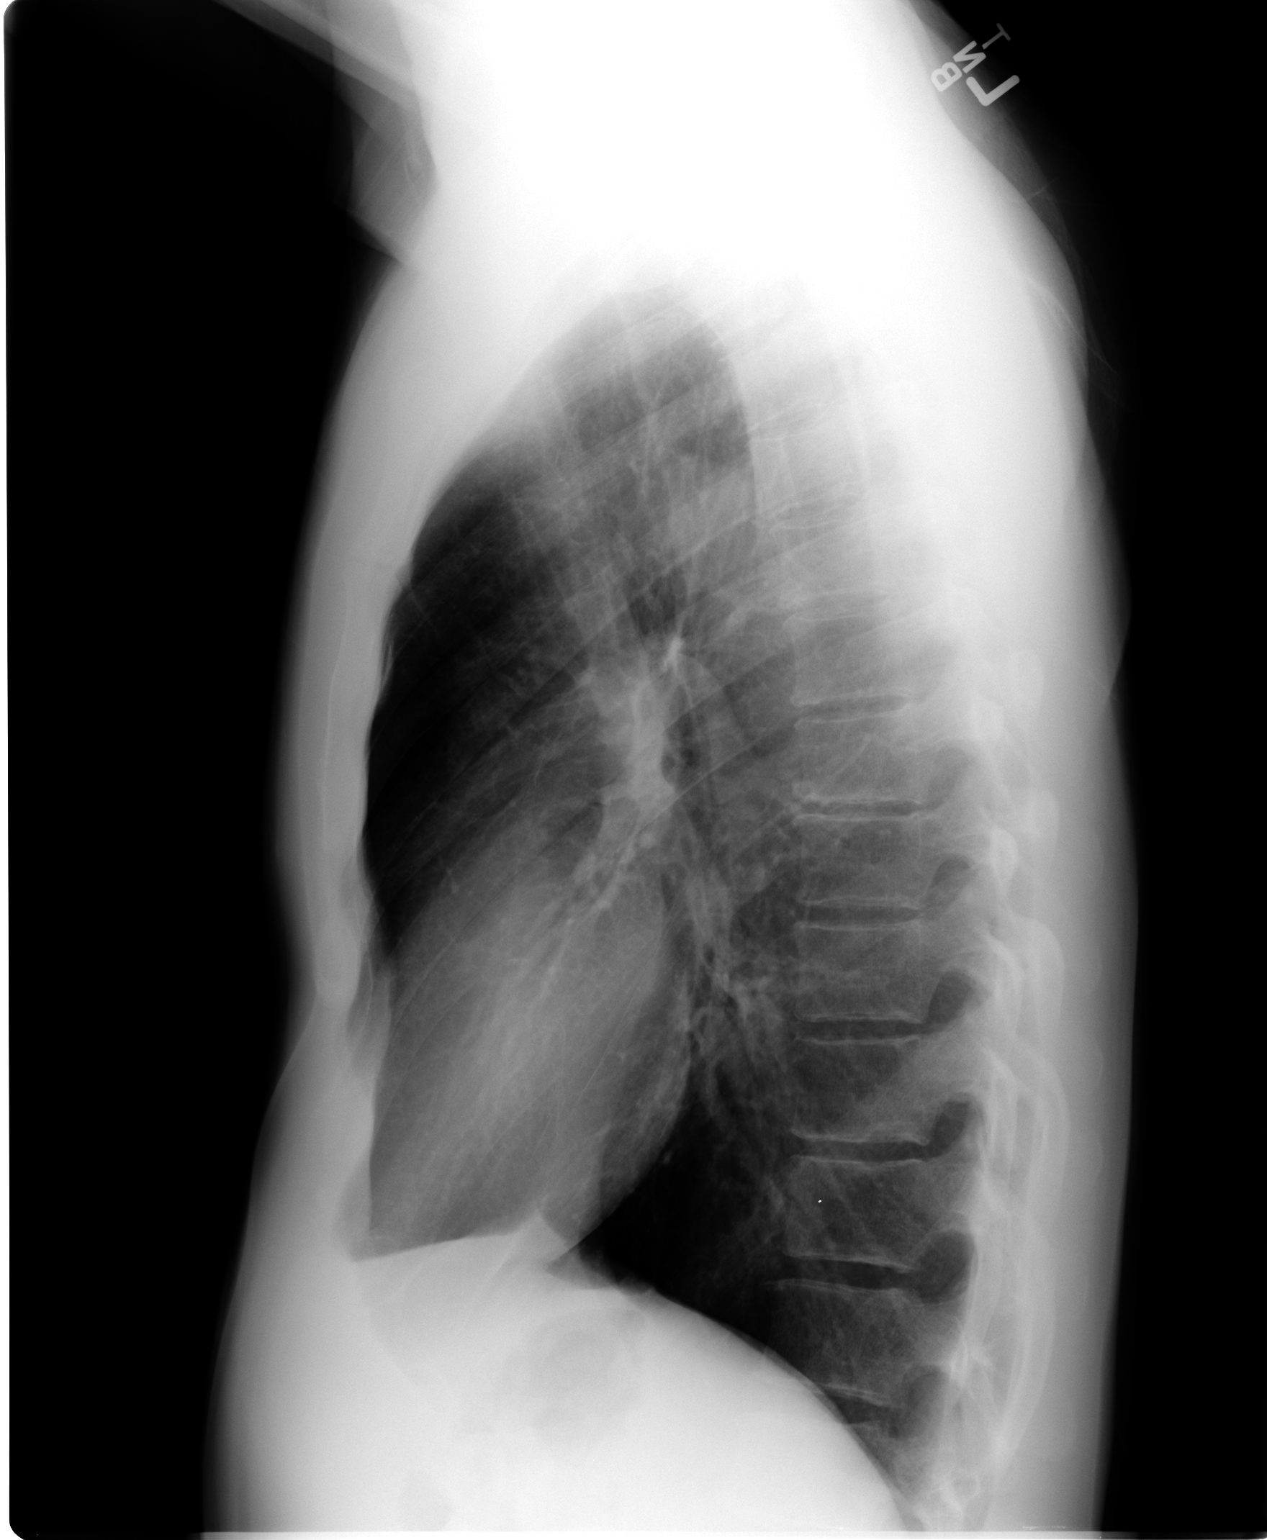

[3 of 3 positions shown; findings below may reference images not displayed]

FINDINGS: The heart size and mediastinal contours are within normal limits.
Both lungs are clear. The visualized skeletal structures are
unremarkable.
IMPRESSION: No active cardiopulmonary disease.
